# Patient Record
Sex: Male | Born: 2000 | Race: Black or African American | Hispanic: No | Marital: Single
Health system: Southern US, Community
[De-identification: ages and names within clinical notes are randomized; demographics above are authoritative.]

## PROBLEM LIST (undated history)

## (undated) DIAGNOSIS — J45909 Unspecified asthma, uncomplicated: Secondary | ICD-10-CM

## (undated) DIAGNOSIS — F909 Attention-deficit hyperactivity disorder, unspecified type: Secondary | ICD-10-CM

## (undated) DIAGNOSIS — Z8489 Family history of other specified conditions: Secondary | ICD-10-CM

## (undated) HISTORY — PX: HYPOSPADIAS CORRECTION: SHX483

---
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
## 2022-11-26 ENCOUNTER — Other Ambulatory Visit: Payer: Self-pay

## 2022-11-26 ENCOUNTER — Emergency Department (HOSPITAL_COMMUNITY)
Admission: EM | Admit: 2022-11-26 | Discharge: 2022-11-26 | Disposition: A | Discharge status: Home / Self Care | Payer: 59 | Attending: Emergency Medicine | Admitting: Emergency Medicine

## 2022-11-26 ENCOUNTER — Encounter (HOSPITAL_COMMUNITY): Payer: Self-pay

## 2022-11-26 DIAGNOSIS — K047 Periapical abscess without sinus: Secondary | ICD-10-CM | POA: Diagnosis not present

## 2022-11-26 DIAGNOSIS — K0889 Other specified disorders of teeth and supporting structures: Secondary | ICD-10-CM | POA: Diagnosis present

## 2022-11-26 MED ORDER — AMOXICILLIN-POT CLAVULANATE 875-125 MG PO TABS: 1.0000 | ORAL_TABLET | Freq: Two times a day (BID) | ORAL | 0 refills | Status: AC

## 2022-11-26 MED ORDER — IBUPROFEN 800 MG PO TABS
800.0000 mg | ORAL_TABLET | Freq: Once | ORAL | Status: AC
  Administered 2022-11-26: 800 mg via ORAL
  Filled 2022-11-26: qty 1

## 2022-11-26 MED ORDER — AMOXICILLIN-POT CLAVULANATE 875-125 MG PO TABS
1.0000 | ORAL_TABLET | Freq: Once | ORAL | Status: AC
  Administered 2022-11-26: 1 via ORAL
  Filled 2022-11-26: qty 1

## 2022-11-26 NOTE — ED Provider Notes (Signed)
[facility] EMERGENCY DEPARTMENT AT [facility] Provider Note   CSN: [id number] Arrival date & time: 11/26/22  1520     History Chief Complaint  Patient presents with   Dental Pain    Tyler Carpenter is a 22 y.o. male. Patient presents to the ED with concerns of dental pain. States that he noticed that he chipped a tooth several months ago but did not have notable pain or sensitivity until about 2 weeks ago. Since then, pain not improving with any at home treatment. No fevers, difficulty swallowing, or trismus. Has not been seen by a dentist and states he does not currently have a dentist in place.   Dental Pain      Home Medications Prior to Admission medications   Medication Sig Start Date End Date Taking? Authorizing Provider  amoxicillin-clavulanate (AUGMENTIN) 875-125 MG tablet Take 1 tablet by mouth every 12 (twelve) hours for 10 days. 11/26/22 12/06/22 Yes Smitty Knudsen, PA-C  ibuprofen (ADVIL,MOTRIN) 200 MG tablet Take 200 mg by mouth every 6 (six) hours as needed for fever or mild pain.    [provider]      Allergies    Patient has no known allergies.    Review of Systems   Review of Systems  HENT:  Positive for dental problem.   All other systems reviewed and are negative.   Physical Exam Updated Vital Signs BP 127/64   Pulse (!) 57   Temp 98.3 F (36.8 C) (Oral)   Resp 15   Ht 5\' 6"  (1.676 m)   Wt 61.2 kg   SpO2 100%   BMI 21.79 kg/m  Physical Exam Vitals and nursing note reviewed.  Constitutional:      General: He is not in acute distress.    Appearance: He is well-developed.  HENT:     Head: Normocephalic and atraumatic.     Mouth/Throat:     Dentition: Abnormal dentition. Gingival swelling present.      Comments: Marked tooth with broken on posteriolateral aspect. Possible contaminants inside the tooth that appear to be infectious material. No bleeding. Some gingival swelling around this cracked tooth. No gumboil or  area of fluctuance in gingiva. Eyes:     Conjunctiva/sclera: Conjunctivae normal.  Cardiovascular:     Rate and Rhythm: Normal rate and regular rhythm.     Heart sounds: No murmur heard. Pulmonary:     Effort: Pulmonary effort is normal. No respiratory distress.     Breath sounds: Normal breath sounds.  Abdominal:     Palpations: Abdomen is soft.     Tenderness: There is no abdominal tenderness.  Musculoskeletal:        General: No swelling.     Cervical back: Neck supple.  Skin:    General: Skin is warm and dry.     Capillary Refill: Capillary refill takes less than 2 seconds.  Neurological:     Mental Status: He is alert.  Psychiatric:        Mood and Affect: Mood normal.     ED Results / Procedures / Treatments   Labs (all labs ordered are listed, but only abnormal results are displayed) Labs Reviewed - No data to display  EKG None  Radiology No results found.  Procedures Procedures   Medications Ordered in ED Medications  amoxicillin-clavulanate (AUGMENTIN) 875-125 MG per tablet 1 tablet (1 tablet Oral Given 11/26/22 1639)  ibuprofen (ADVIL) tablet 800 mg (800 mg Oral Given 11/26/22 1639)    ED  Course/ Medical Decision Making/ A&P                               Medical Decision Making Risk Prescription drug management.   This patient presents to the ED for concern of dental pain.  Differential diagnosis includes dental abscess, dental infection, dental caries, peritonsillar abscess   Medicines ordered and prescription drug management:  I ordered medication including Augmentin, ibuprofen for dental infection, pain Reevaluation of the patient after these medicines showed that the patient stayed the same I have reviewed the patients home medicines and have made adjustments as needed   Problem List / ED Course:  Patient presented to the ED with concerns of dental pain. States that this has been going on for the last 2 weeks or so without improvement.  Denies any drainage coming from tooth, but tried topical and oral medications for pain with only brief improvement. No fevers. Concern for dental infection. On examination, there does appear to be a broken tooth on the right lower side which does appear to have some purulent drainage coming from.  Likely infectious material in nature.  Given lack of fever, or other SIRS criteria, do not believe the patient would benefit from lab initiation at this time.  Instead we will treat as dental infection with Augmentin.  Will provide patient with handout for dental resources in the community as well as he was informed clearly that he needs to be seen by dentist to fully treat this. Patient verbalized understanding need for outpatient dental follow up. Advised patient to return to the ED if symptoms are not improving or begins to experience any SOB, difficulty swallowing, or fevers. All questions answered prior to patient discharge.  Final Clinical Impression(s) / ED Diagnoses Final diagnoses:  Dental infection  Tooth pain    Rx / DC Orders ED Discharge Orders          Ordered    amoxicillin-clavulanate (AUGMENTIN) 875-125 MG tablet  Every 12 hours        11/26/22 1624              Smitty Knudsen, PA-C 11/26/22 1821    Gloris Manchester, MD 11/26/22 [phone]

## 2022-11-26 NOTE — ED Triage Notes (Signed)
Pt coming in complaining of tooth pain in the bottom right side of jaw. Pt states pain began apprx 2.5 wks ago.

## 2022-11-26 NOTE — Discharge Instructions (Signed)
You were seen in the ER today for dental pain. On my exam, you have a broken tooth with what appears to be infection around this tooth that is likely causing your pain. I would advise folllowing up with a dentist for further evaluation and treatment as you will likely require some dental procedure to have this taken care of. I have provided you with a resource guide of local dentist which can be of assistance as you do not currently have a dentist you are seeing. If symptoms worsen, return to the ER.
